# Patient Record
Sex: Male | Born: 1980 | Race: White | Marital: Married
Health system: Southern US, Community
[De-identification: ages and names within clinical notes are randomized; demographics above are authoritative.]

---

## 2013-09-15 ENCOUNTER — Other Ambulatory Visit: Payer: Self-pay | Admitting: Family

## 2013-09-15 ENCOUNTER — Ambulatory Visit
Admission: RE | Admit: 2013-09-15 | Discharge: 2013-09-15 | Disposition: A | Discharge status: Home / Self Care | Payer: Worker's Compensation | Source: Ambulatory Visit | Attending: Family | Admitting: Family

## 2013-09-15 DIAGNOSIS — M545 Low back pain, unspecified: Secondary | ICD-10-CM

## 2015-01-27 IMAGING — CR DG LUMBAR SPINE COMPLETE 4+V
5 series · 5 of 5 positions shown · non-contrast
Comparison: None.

CLINICAL DATA: Back injury 5 days ago complaining of right-sided
low back pain

EXAM:
LUMBAR SPINE - COMPLETE 4+ VIEW

[view not recorded (1 of 5)]
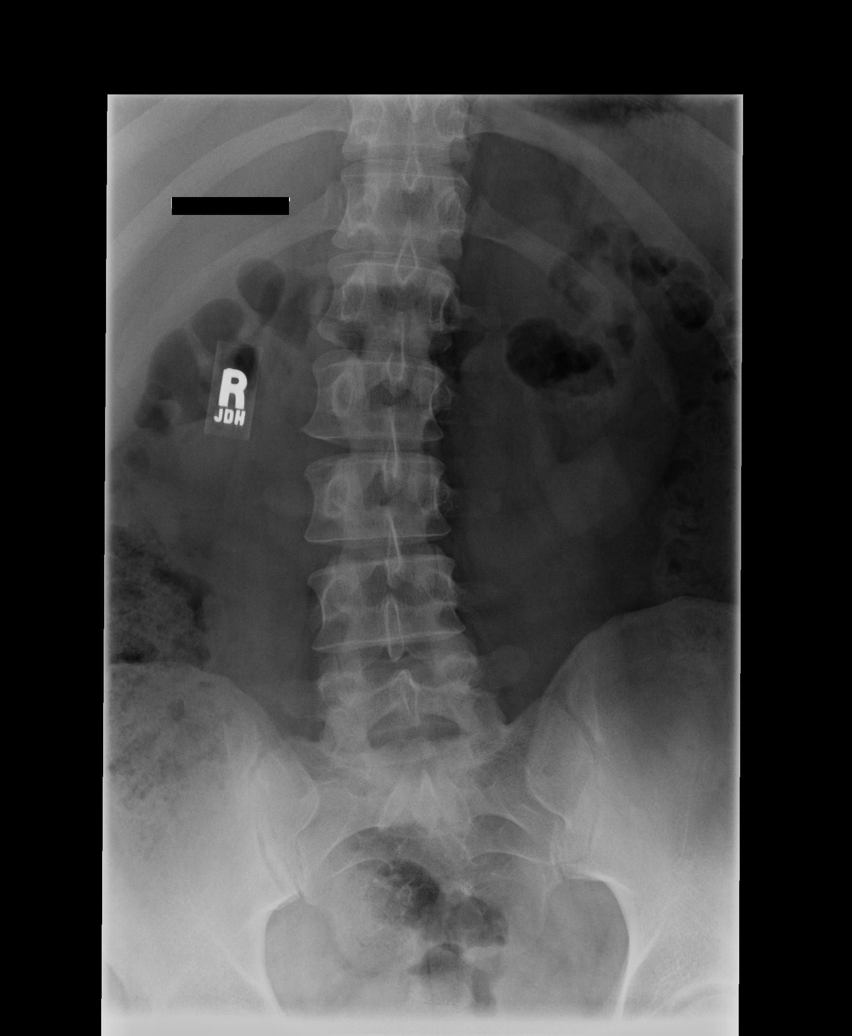

[view not recorded (2 of 5)]
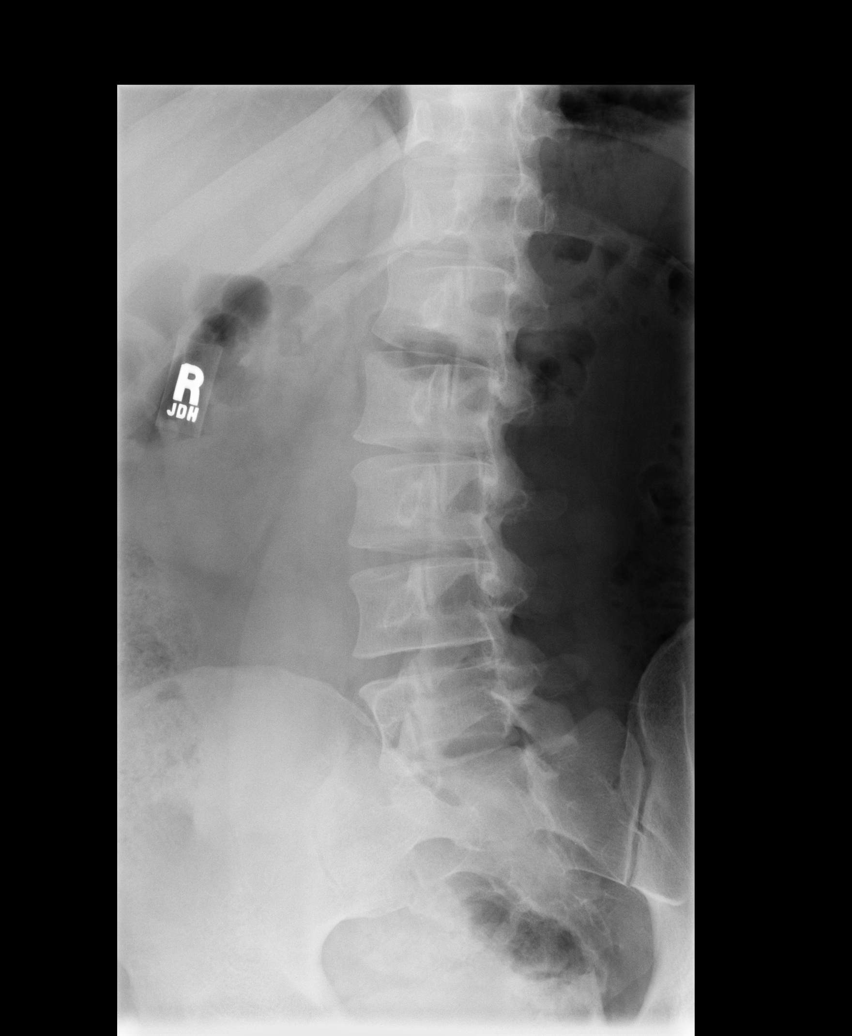

[view not recorded (3 of 5)]
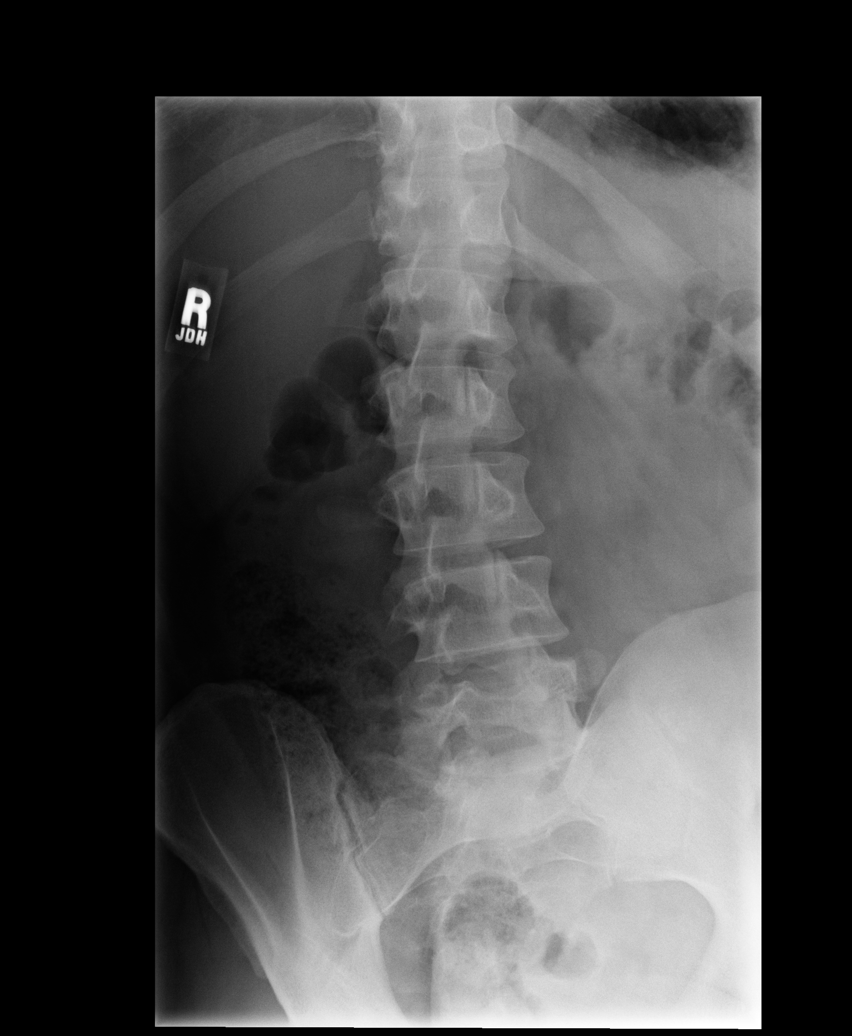

[view not recorded (4 of 5)]
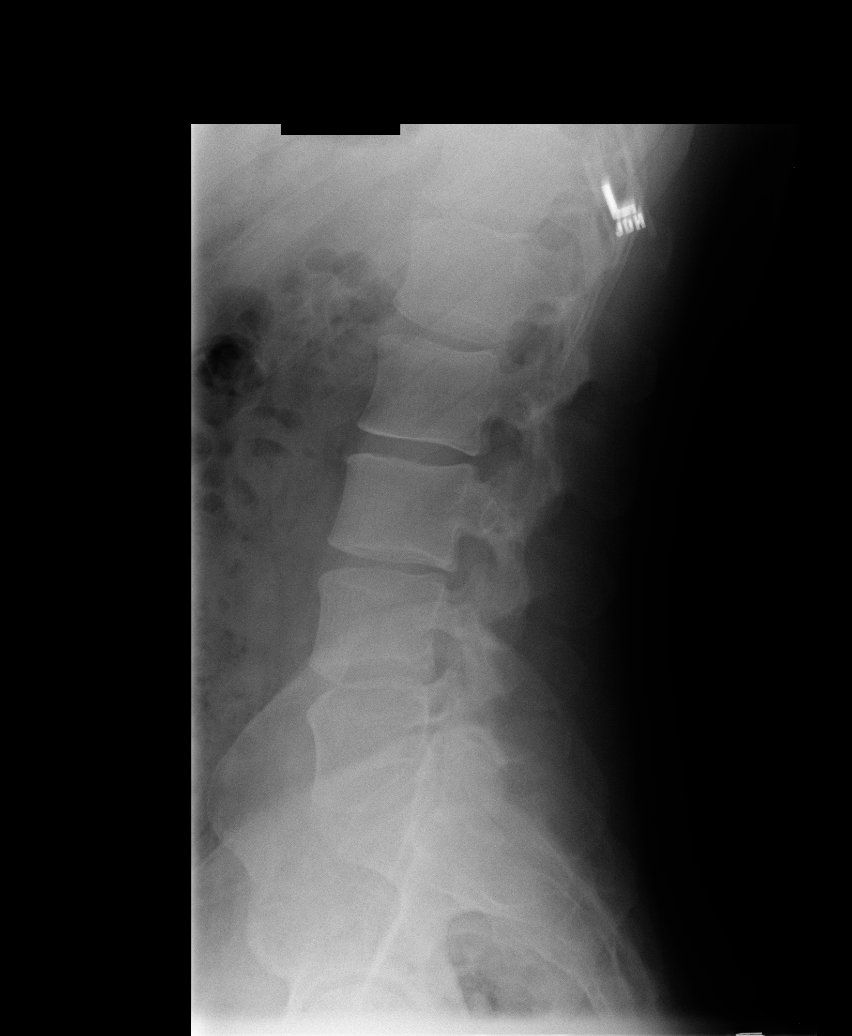

[view not recorded (5 of 5)]
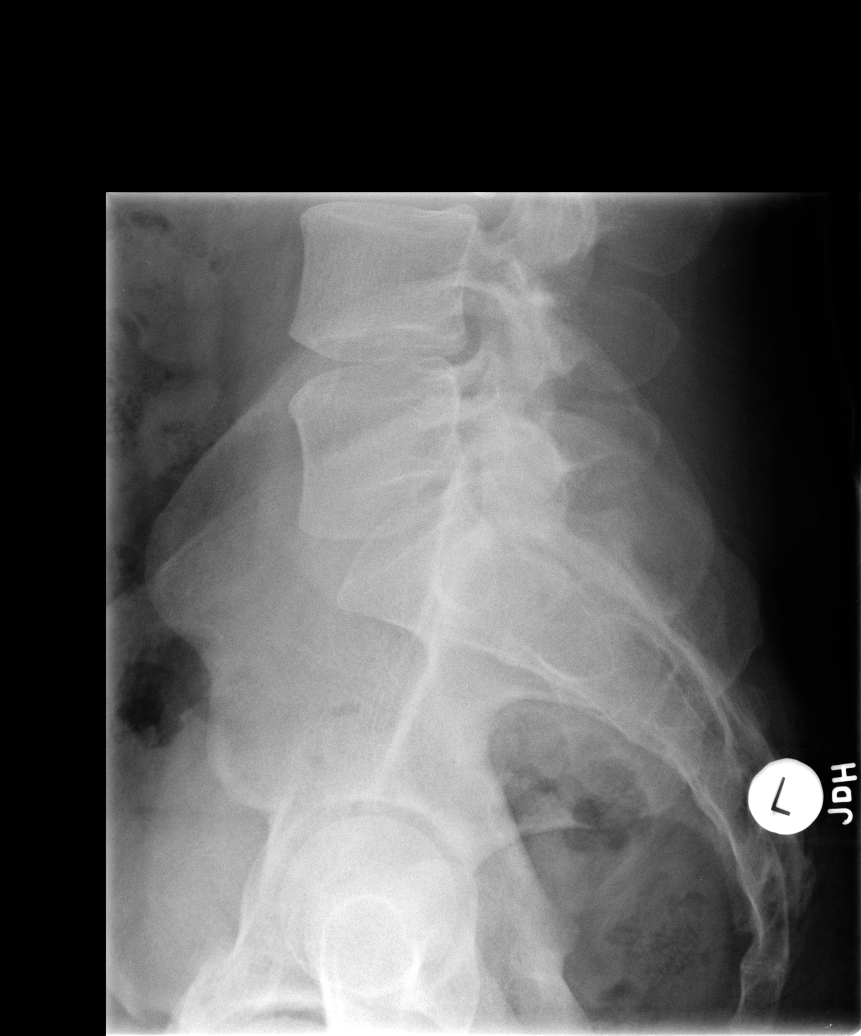

[5 of 5 positions shown; findings below may reference images not displayed]

FINDINGS: Mild to moderate dextroscoliosis of the lumbar spine. Normal
anterior-posterior alignment. No significant degenerative change. No
fracture.
IMPRESSION: Scoliosis.  Otherwise negative.
